# Patient Record
Sex: Female | Born: 1995 | Race: White | Hispanic: Yes | Marital: Single | Smoking: Current every day smoker
Health system: Southern US, Community
[De-identification: ages and names within clinical notes are randomized; demographics above are authoritative.]

---

## 2018-10-27 ENCOUNTER — Other Ambulatory Visit: Payer: Self-pay

## 2018-10-27 ENCOUNTER — Emergency Department: Payer: Self-pay

## 2018-10-27 ENCOUNTER — Emergency Department
Admission: EM | Admit: 2018-10-27 | Discharge: 2018-10-28 | Disposition: A | Discharge status: Home / Self Care | Payer: Self-pay | Attending: Emergency Medicine | Admitting: Emergency Medicine

## 2018-10-27 DIAGNOSIS — D72825 Bandemia: Secondary | ICD-10-CM | POA: Insufficient documentation

## 2018-10-27 DIAGNOSIS — R079 Chest pain, unspecified: Secondary | ICD-10-CM | POA: Insufficient documentation

## 2018-10-27 DIAGNOSIS — F17210 Nicotine dependence, cigarettes, uncomplicated: Secondary | ICD-10-CM | POA: Insufficient documentation

## 2018-10-27 LAB — CBC
HEMATOCRIT: 34.8 % — AB (ref 36.0–46.0)
Hemoglobin: 10.8 g/dL — ABNORMAL LOW (ref 12.0–15.0)
MCH: 23.4 pg — ABNORMAL LOW (ref 26.0–34.0)
MCHC: 31 g/dL (ref 30.0–36.0)
MCV: 75.3 fL — AB (ref 80.0–100.0)
Platelets: 485 10*3/uL — ABNORMAL HIGH (ref 150–400)
RBC: 4.62 MIL/uL (ref 3.87–5.11)
RDW: 16.5 % — ABNORMAL HIGH (ref 11.5–15.5)
WBC: 26.8 10*3/uL — ABNORMAL HIGH (ref 4.0–10.5)
nRBC: 0.1 % (ref 0.0–0.2)

## 2018-10-27 LAB — BASIC METABOLIC PANEL
Anion gap: 7 (ref 5–15)
BUN: 16 mg/dL (ref 6–20)
CHLORIDE: 106 mmol/L (ref 98–111)
CO2: 24 mmol/L (ref 22–32)
Calcium: 9.2 mg/dL (ref 8.9–10.3)
Creatinine, Ser: 0.66 mg/dL (ref 0.44–1.00)
GFR calc Af Amer: >60 mL/min (ref >60)
GFR calc non Af Amer: >60 mL/min (ref >60)
Glucose, Bld: 103 mg/dL — ABNORMAL HIGH (ref 70–99)
Potassium: 3.5 mmol/L (ref 3.5–5.1)
Sodium: 137 mmol/L (ref 135–145)

## 2018-10-27 LAB — TROPONIN I: Troponin I: <0.03 ng/mL (ref <0.03)

## 2018-10-27 LAB — POCT PREGNANCY, URINE: Preg Test, Ur: NEGATIVE

## 2018-10-27 MED ORDER — SODIUM CHLORIDE 0.9% FLUSH: 3.0000 mL | Freq: Once | INTRAVENOUS | Status: DC

## 2018-10-27 NOTE — ED Triage Notes (Signed)
Pt arrives to ED via POV from home with c/o palpitations x1 day. Pt reports "it was painful, but right now I just feel SHOB". No h/x of cardiac issues. Pt reports 1 episode of emesis today, and 2 episodes of diarrhea. No fever. No radiation of chest pain, no diaphoresis. Pt is A&O, in NAD; RR even, regular and unlabored. Pt denies previous cardiac h/x, but states "my dad did".

## 2018-10-28 LAB — URINALYSIS, ROUTINE W REFLEX MICROSCOPIC
Bacteria, UA: NONE SEEN
Bilirubin Urine: NEGATIVE
Glucose, UA: NEGATIVE mg/dL
Ketones, ur: 20 mg/dL — AB
Leukocytes, UA: NEGATIVE
Nitrite: NEGATIVE
Protein, ur: NEGATIVE mg/dL
SPECIFIC GRAVITY, URINE: 1.027 (ref 1.005–1.030)
pH: 6 (ref 5.0–8.0)

## 2018-10-28 LAB — CBC WITH DIFFERENTIAL/PLATELET
Abs Immature Granulocytes: 0.08 10*3/uL — ABNORMAL HIGH (ref 0.00–0.07)
Basophils Absolute: 0.1 10*3/uL (ref 0.0–0.1)
Basophils Relative: 0 %
Eosinophils Absolute: 0.1 10*3/uL (ref 0.0–0.5)
Eosinophils Relative: 0 %
HCT: 32.8 % — ABNORMAL LOW (ref 36.0–46.0)
Hemoglobin: 10.2 g/dL — ABNORMAL LOW (ref 12.0–15.0)
Immature Granulocytes: 0 %
Lymphocytes Relative: 9 %
Lymphs Abs: 1.9 10*3/uL (ref 0.7–4.0)
MCH: 23.5 pg — ABNORMAL LOW (ref 26.0–34.0)
MCHC: 31.1 g/dL (ref 30.0–36.0)
MCV: 75.6 fL — ABNORMAL LOW (ref 80.0–100.0)
Monocytes Absolute: 0.7 10*3/uL (ref 0.1–1.0)
Monocytes Relative: 3 %
Neutro Abs: 18.5 10*3/uL — ABNORMAL HIGH (ref 1.7–7.7)
Neutrophils Relative %: 88 %
Platelets: 438 10*3/uL — ABNORMAL HIGH (ref 150–400)
RBC: 4.34 MIL/uL (ref 3.87–5.11)
RDW: 16.5 % — AB (ref 11.5–15.5)
WBC: 21.3 10*3/uL — ABNORMAL HIGH (ref 4.0–10.5)
nRBC: 0 % (ref 0.0–0.2)

## 2018-10-28 LAB — FIBRIN DERIVATIVES D-DIMER (ARMC ONLY): Fibrin derivatives D-dimer (ARMC): 279.44 ng/mL (FEU) (ref 0.00–499.00)

## 2018-10-28 NOTE — ED Provider Notes (Signed)
[facility] Emergency Department Provider Note  ____________________________________________   First MD Initiated Contact with Patient 10/27/18 2356     (approximate)  I have reviewed the triage vital signs and the nursing notes.   HISTORY  Chief Complaint Chest Pain   HPI Julie Allison is a 23 y.o. female who presents to the emergency department for evaluation and treatment of chest pain and palpitations that started earlier today.  She states that started while she was at work.  She is not having any pain in the chest currently but she does feels short of breath.  She does not have any history of cardiac issues and has never been evaluated by cardiology.  She had one single episode of emesis earlier today with a couple episodes of diarrhea but none over the past several hours.  She denies fever.  She has no history of DVT.  She is not on any birth control and has not had any long periods of immobilization including travel or bone fracture requiring casting.  No significant family history of DVT or PE.   History reviewed. No pertinent past medical history.  There are no active problems to display for this patient.   History reviewed. No pertinent surgical history.  Prior to Admission medications   Not on File    Allergies Patient has no known allergies.  No family history on file.  Social History Social History   Tobacco Use  . Smoking status: Current Every Day Smoker  . Smokeless tobacco: Never Used  Substance Use Topics  . Alcohol use: Not on file  . Drug use: Not on file    Review of Systems  Constitutional: No fever/chills. Eyes: No visual changes. ENT: No sore throat. Cardiovascular: Positive for chest pain that has since resolved.  Negative for pleuritic pain.  Positive for palpitations.  Negative for leg pain. Respiratory: Positive shortness of breath. Gastrointestinal: Negative for abdominal pain.  No nausea, no vomiting.   No diarrhea.  No constipation. Genitourinary: Negative for dysuria. Musculoskeletal: Negative for back pain.  Skin: Negative for rash, lesion, wound. Neurological: Negative for headaches, focal weakness or numbness. ____________________________________________   PHYSICAL EXAM:  VITAL SIGNS: ED Triage Vitals  Enc Vitals Group     BP 10/27/18 2053 115/71     Pulse Rate 10/27/18 2053 97     Resp 10/27/18 2053 18     Temp 10/27/18 2053 (!) 97.4 F (36.3 C)     Temp Source 10/27/18 2053 Oral     SpO2 10/27/18 2053 100 %     Weight 10/27/18 2054 165 lb (74.8 kg)     Height 10/27/18 2054 4\' 11"  (1.499 m)     Head Circumference --      Peak Flow --      Pain Score 10/27/18 2059 6     Pain Loc --      Pain Edu? --      Excl. in GC? --     Constitutional: Alert and oriented.  Well appearing and in no acute distress.  Normal mental status. Eyes: Conjunctivae are normal. PERRL. Head: Atraumatic. Nose: No congestion/rhinnorhea. Mouth/Throat: Mucous membranes are moist.  Oropharynx non-erythematous. Tongue normal in size and color. Neck: No stridor.  No carotid bruit appreciated on exam. Hematological/Lymphatic/Immunilogical: No cervical lymphadenopathy. Cardiovascular: Normal rate, regular rhythm. Grossly normal heart sounds.  Good peripheral circulation. Respiratory: Normal respiratory effort.  No retractions. Lungs CTAB. Gastrointestinal: Soft and nontender. No distention. No abdominal bruits. No CVA  tenderness. Genitourinary: Exam deferred. Musculoskeletal: No lower extremity tenderness.  No edema of extremities. Neurologic:  Normal speech and language. No gross focal neurologic deficits are appreciated. Skin:  Skin is warm, dry and intact. No rash noted. Psychiatric: Mood and affect are normal. Speech and behavior are normal.  ____________________________________________   LABS (all labs ordered are listed, but only abnormal results are displayed)  Labs Reviewed  BASIC  METABOLIC PANEL - Abnormal; Notable for the following components:      Result Value   Glucose, Bld 103 (*)    All other components within normal limits  CBC - Abnormal; Notable for the following components:   WBC 26.8 (*)    Hemoglobin 10.8 (*)    HCT 34.8 (*)    MCV 75.3 (*)    MCH 23.4 (*)    RDW 16.5 (*)    Platelets 485 (*)    All other components within normal limits  CBC WITH DIFFERENTIAL/PLATELET - Abnormal; Notable for the following components:   WBC 21.3 (*)    Hemoglobin 10.2 (*)    HCT 32.8 (*)    MCV 75.6 (*)    MCH 23.5 (*)    RDW 16.5 (*)    Platelets 438 (*)    Neutro Abs 18.5 (*)    Abs Immature Granulocytes 0.08 (*)    All other components within normal limits  URINALYSIS, ROUTINE W REFLEX MICROSCOPIC - Abnormal; Notable for the following components:   Color, Urine YELLOW (*)    APPearance CLEAR (*)    Hgb urine dipstick SMALL (*)    Ketones, ur 20 (*)    All other components within normal limits  TROPONIN I  FIBRIN DERIVATIVES D-DIMER ([facility] ONLY)  POC URINE PREG, ED  POCT PREGNANCY, URINE   ____________________________________________  EKG  ED ECG REPORT I, Sumi Lye, FNP-BC personally viewed and interpreted this ECG.   Date: 10/28/2018  EKG Time: 0005  Rate: 81  Rhythm: normal sinus rhythm  Axis: Normal  Intervals:none  ST&T Change: No ST elevation  ____________________________________________  RADIOLOGY  ED MD interpretation:    Official radiology report(s): No results found.  ____________________________________________   PROCEDURES  Procedure(s) performed: None  Procedures  Critical Care performed: No  ____________________________________________   INITIAL IMPRESSION / ASSESSMENT AND PLAN / ED COURSE  As part of my medical decision making, I reviewed the following data within the electronic MEDICAL RECORD NUMBER Evaluated by EM attending Forbach  23 year old female presenting to the emergency department for treatment  and evaluation of chest pain, palpitations, shortness of breath.  Exam is very reassuring.  She is low risk for DVT or PE.  D-dimer added to labs already collected.  It is noted that her white blood cell count is elevated considerably, however the patient denies cough, fever, headache, or recent illness of any type.  She is unaware if she has any history of chronic leukocytosis, but denies any chronic illnesses.   All testing is reassuring.  On redraw of the CBC the white count is 21.3.  Patient at this time continues to deny active chest pain, but states that she does have intermittent palpitations.  She states that she drinks quite a bit of caffeine and uses a vape pen pretty often throughout the day.  She is safe to be discharged home but will be encouraged to call and schedule follow-up appointment with cardiology.  She will be provided the name and number.  She will be given a work excuse for tomorrow.  Strict ER return precautions were discussed and she is willing to return to the emergency department if pain in the chest returns or if she has any symptoms of concern.  Clinical Course as of Oct 30 19  Mon Oct 27, 2018  2327 WBC(!): 26.8 [RB]    Clinical Course User Index [RB] Darci Current, MD     ____________________________________________   FINAL CLINICAL IMPRESSION(S) / ED DIAGNOSES  Final diagnoses:  Nonspecific chest pain  Bandemia     ED Discharge Orders    None       Note:  This document was prepared using Dragon voice recognition software and may include unintentional dictation errors.    Chinita Pester, FNP 10/29/18 Moses Manners    Loleta Rose, MD 10/29/18 [phone]

## 2018-10-28 NOTE — ED Provider Notes (Signed)
Medical screening examination/treatment/procedure(s) were conducted as a shared visit with non-physician practitioner(s) and myself.  I personally evaluated the patient during the encounter.  ED ECG REPORT I, Loleta Roseory Azarius Lambson, the attending physician, personally viewed and interpreted this ECG.  Date: 10/28/2018 EKG Time: 00: 05 Rate: 81 Rhythm: normal sinus rhythm QRS Axis: normal Intervals: normal ST/T Wave abnormalities: normal Narrative Interpretation: no evidence of acute ischemia   I evaluated the patient personally and had several discussions with her.  She had relatively acute onset chest pain and and some shortness of breath associated with sensation of palpitations.  This occurred almost 12 hours prior to her evaluation in the emergency department.  Although she was initially borderline tachycardic, her tachycardia improved and she had a heart rate in the low 90s each time that I evaluated her.  She is technically PERC negative, but even if we consider that she briefly had a heart rate greater than 100 she has a Wells score for PE of 1.5.  She has no history of blood clots in the legs of the lungs and we further was stratified her by obtaining a d-dimer which was well within normal limits which is quite reassuring.  I suspect she is either having some musculoskeletal pain or she is having pain and a possible anxiety reaction associated with the symptoms she was experiencing (palpitations, likely PVCs).  She has had palpitations in the past and they seem associated with caffeine use.  Each time I spoke with the patient she was in no distress and asymptomatic, cheerful, and acting appropriate.  The only significant abnormality on her lab work was a significant leukocytosis initially of almost 27, and then after repeat was down to about 21.  This is nonspecific and likely represents a stress reaction to her symptoms earlier.  She has had absolutely no infectious symptoms, has no sign of  pneumonia on chest x-ray, no signs of viral illness, and a negative urinalysis.  We encourage close outpatient follow-up with her regular doctor and/or with cardiology to discuss whether or not she would benefit from a Holter monitor.  We gave her strict return precautions and she understands and agrees with the plan.   Loleta RoseForbach, Chez Bulnes, MD 10/28/18 [phone]

## 2018-10-28 NOTE — Discharge Instructions (Signed)
If your pain becomes persistent, gets worse, or you begin to have other symptoms of concern, return to the ER.  Call and schedule an appointment with cardiology.

## 2019-06-09 IMAGING — CR DG CHEST 2V
1 series · 2 of 2 positions shown · non-contrast
Comparison: None.

CLINICAL DATA: Chest pain with palpitations.

EXAM:
CHEST - 2 VIEW

[Series 1: dg chest 2 view · 0.14mm/px · 2 of 2 slices shown]
[im 1/2]
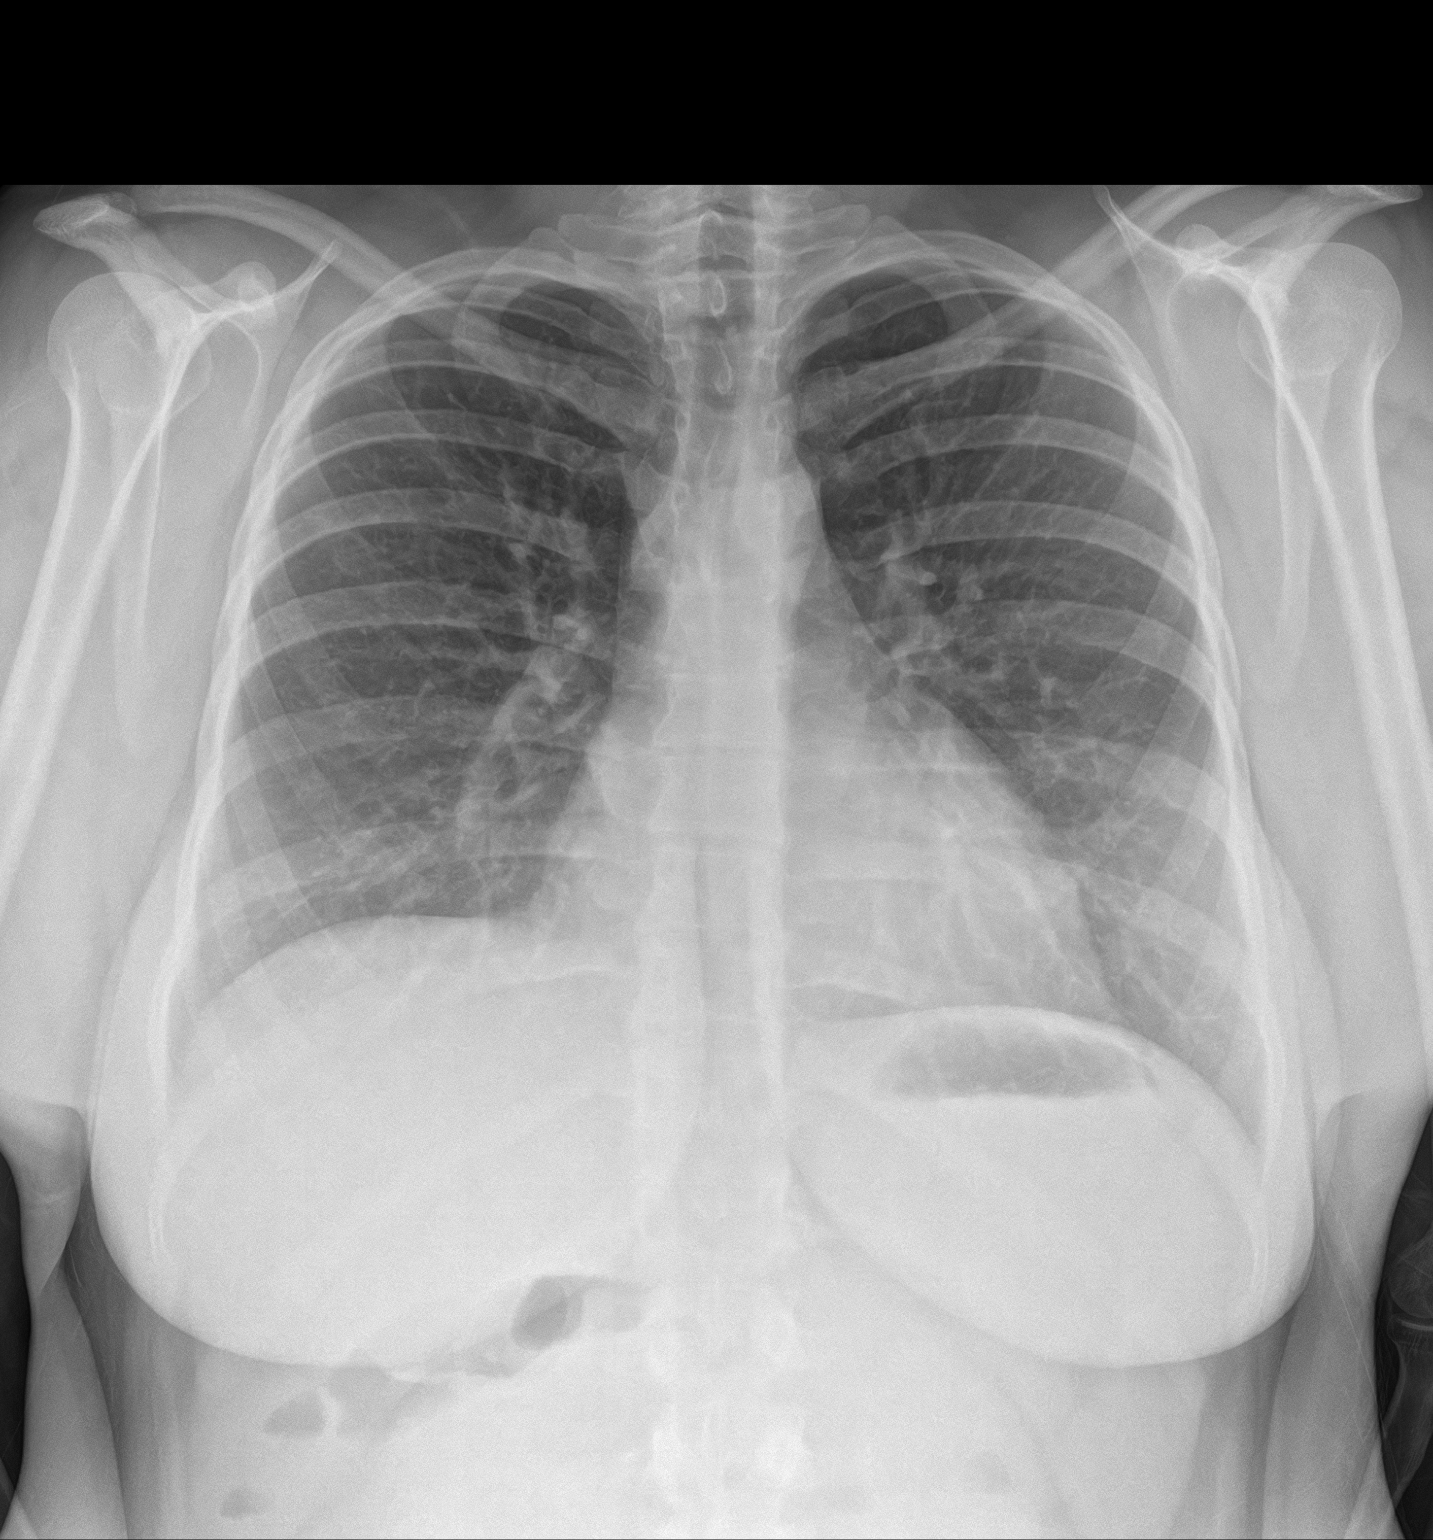
[im 2/2]
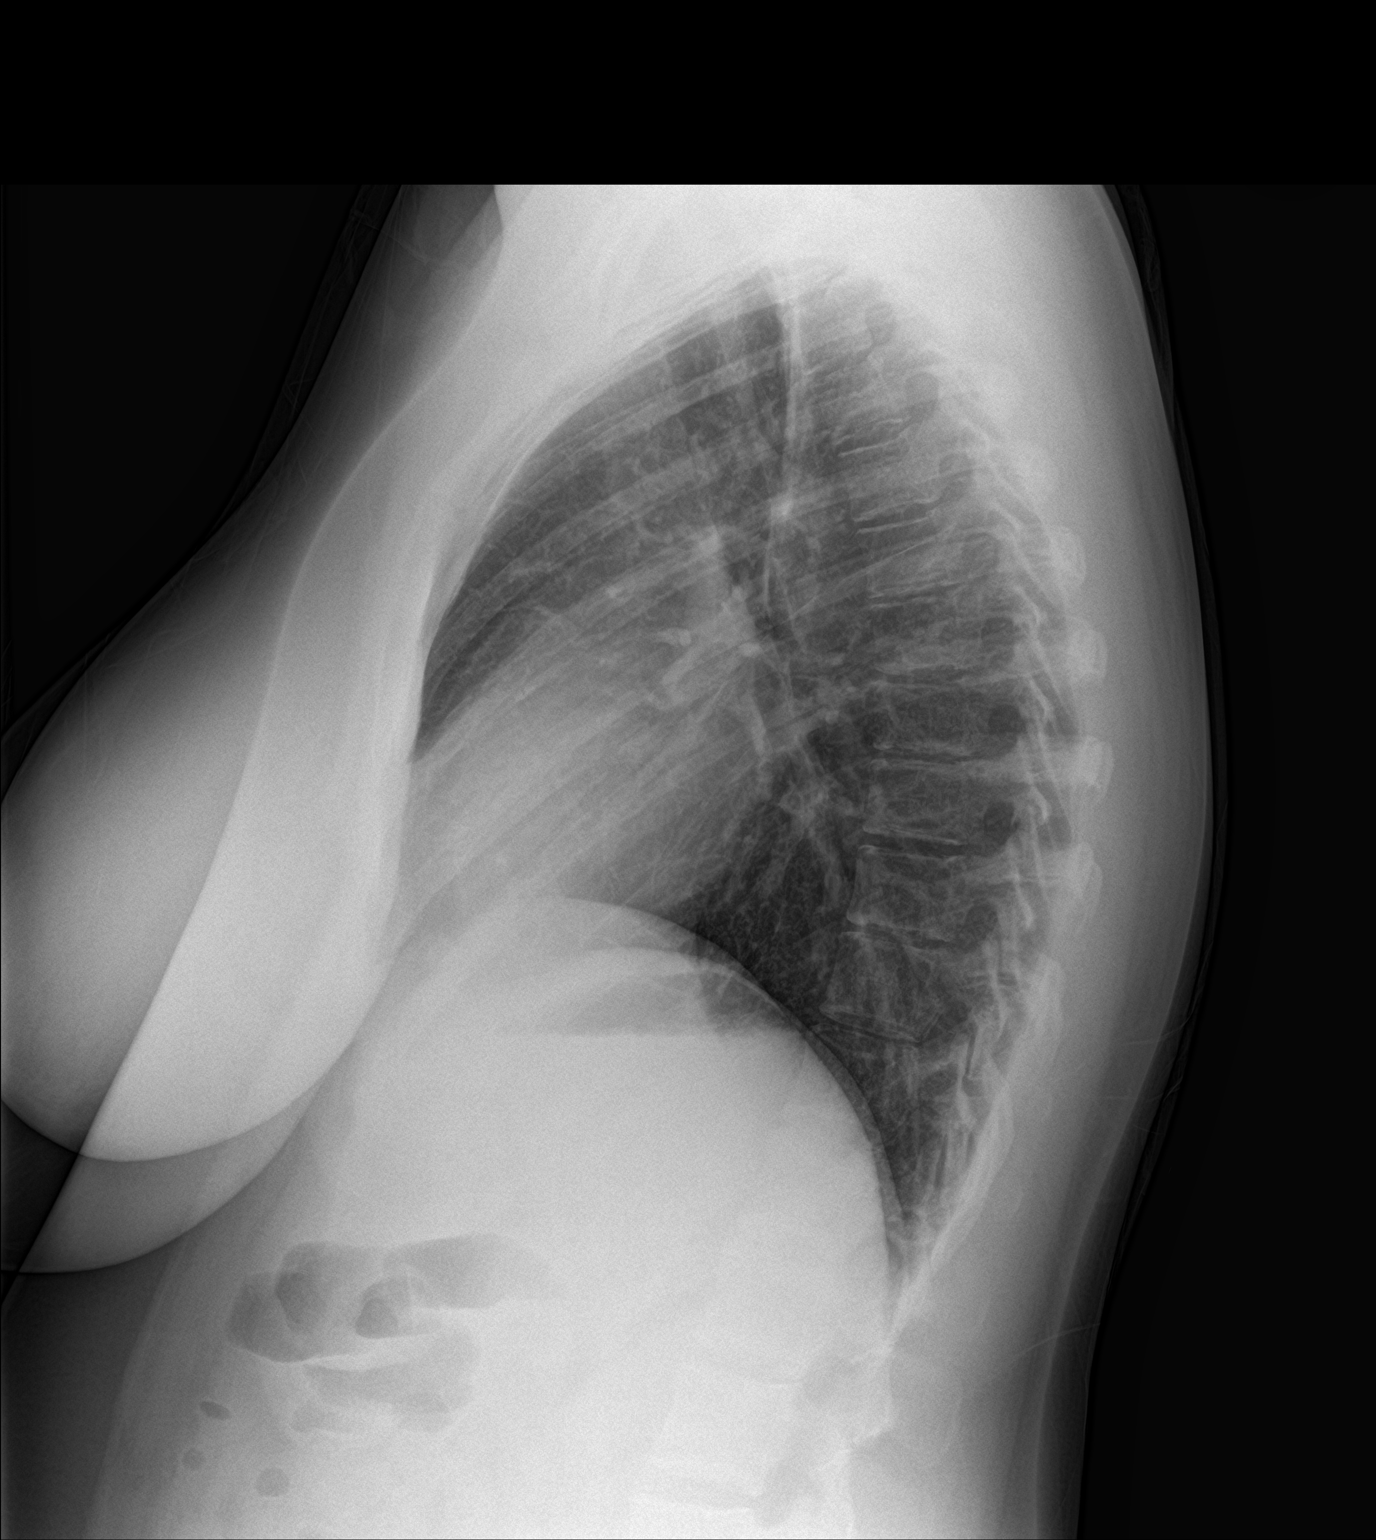

[2 of 2 positions shown; findings below may reference images not displayed]

FINDINGS: The cardiomediastinal contours are normal. The lungs are clear.
Pulmonary vasculature is normal. No consolidation, pleural effusion,
or pneumothorax. No acute osseous abnormalities are seen.
IMPRESSION: No acute chest findings.

## 2021-05-25 ENCOUNTER — Other Ambulatory Visit (HOSPITAL_COMMUNITY): Payer: Self-pay
